# Patient Record
Sex: Male | Born: 1995 | Race: White | Hispanic: No | Marital: Single
Health system: Southern US, Community
[De-identification: ages and names within clinical notes are randomized; demographics above are authoritative.]

## PROBLEM LIST (undated history)

## (undated) DIAGNOSIS — I10 Essential (primary) hypertension: Secondary | ICD-10-CM

---

## 2019-03-04 ENCOUNTER — Other Ambulatory Visit: Payer: Self-pay | Admitting: Unknown Physician Specialty

## 2019-03-04 DIAGNOSIS — R221 Localized swelling, mass and lump, neck: Secondary | ICD-10-CM

## 2019-03-04 DIAGNOSIS — L040 Acute lymphadenitis of face, head and neck: Secondary | ICD-10-CM

## 2019-03-13 ENCOUNTER — Ambulatory Visit
Admission: RE | Admit: 2019-03-13 | Discharge: 2019-03-13 | Disposition: A | Discharge status: Home / Self Care | Payer: BC Managed Care – PPO | Source: Ambulatory Visit | Attending: Unknown Physician Specialty | Admitting: Unknown Physician Specialty

## 2019-03-13 ENCOUNTER — Other Ambulatory Visit: Payer: Self-pay

## 2019-03-13 DIAGNOSIS — L040 Acute lymphadenitis of face, head and neck: Secondary | ICD-10-CM | POA: Insufficient documentation

## 2019-03-13 DIAGNOSIS — R221 Localized swelling, mass and lump, neck: Secondary | ICD-10-CM

## 2019-03-13 HISTORY — DX: Essential (primary) hypertension: I10

## 2019-03-13 LAB — POCT I-STAT CREATININE: Creatinine, Ser: 0.9 mg/dL (ref 0.61–1.24)

## 2019-03-13 MED ORDER — IOHEXOL 300 MG/ML  SOLN
75.0000 mL | Freq: Once | INTRAMUSCULAR | Status: AC | PRN
  Administered 2019-03-13: 75 mL via INTRAVENOUS

## 2019-03-21 ENCOUNTER — Other Ambulatory Visit: Payer: Self-pay | Admitting: Unknown Physician Specialty

## 2019-03-21 DIAGNOSIS — R221 Localized swelling, mass and lump, neck: Secondary | ICD-10-CM

## 2019-03-28 ENCOUNTER — Ambulatory Visit: Payer: BC Managed Care – PPO

## 2019-04-03 ENCOUNTER — Other Ambulatory Visit: Payer: Self-pay | Admitting: Radiology

## 2019-04-03 ENCOUNTER — Ambulatory Visit: Payer: BC Managed Care – PPO

## 2019-04-04 ENCOUNTER — Ambulatory Visit
Admission: RE | Admit: 2019-04-04 | Discharge: 2019-04-04 | Disposition: A | Discharge status: Home / Self Care | Payer: BC Managed Care – PPO | Source: Ambulatory Visit | Attending: Unknown Physician Specialty | Admitting: Unknown Physician Specialty

## 2019-04-04 ENCOUNTER — Other Ambulatory Visit: Payer: Self-pay

## 2019-04-04 ENCOUNTER — Other Ambulatory Visit: Payer: Self-pay | Admitting: Unknown Physician Specialty

## 2019-04-04 DIAGNOSIS — R221 Localized swelling, mass and lump, neck: Secondary | ICD-10-CM

## 2019-04-04 NOTE — Progress Notes (Signed)
Procedure not done-see MD note.  Released to go home.

## 2019-08-30 IMAGING — US SOFT TISSUE ULTRASOUND HEAD/NECK
1 series · 2 of 2 positions shown · non-contrast
Comparison: 03/13/2019

CLINICAL DATA: Palpable right neck mass with abnormal lymph nodes
on recent CT examination

EXAM:
ULTRASOUND OF HEAD/NECK SOFT TISSUES
TECHNIQUE: Ultrasound examination of the head and neck soft tissues was
performed in the area of clinical concern.

[Series 1: soft tissue ultrasound head/neck · 2 of 2 slices shown]
[im 1/2]
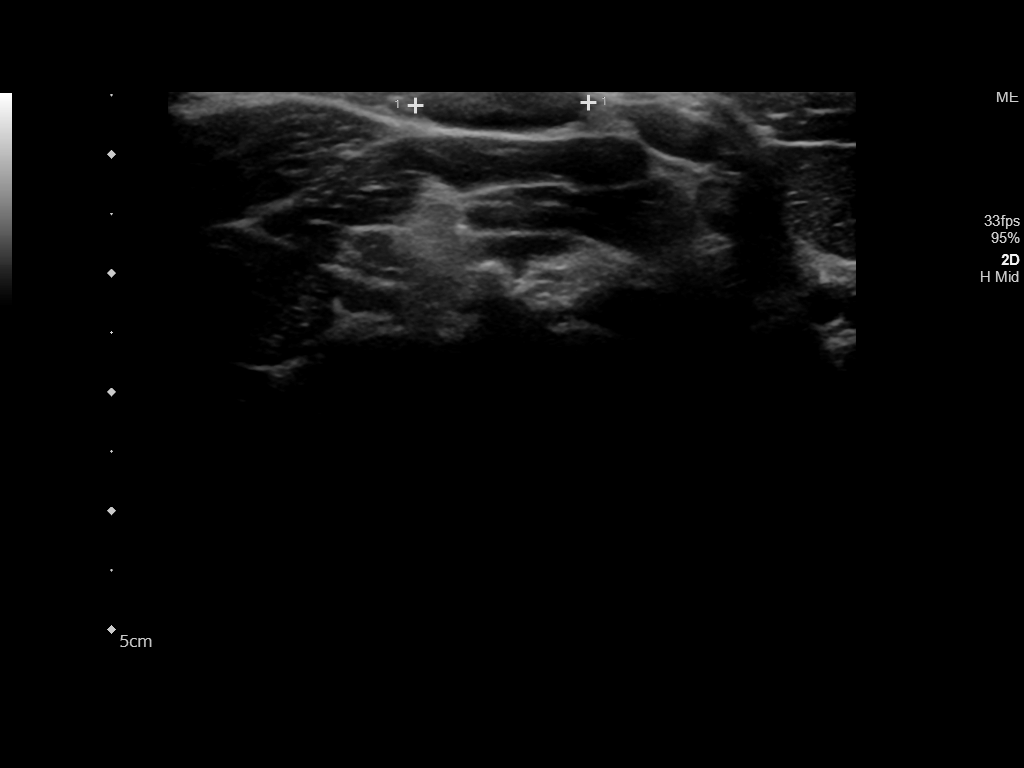
[im 2/2]
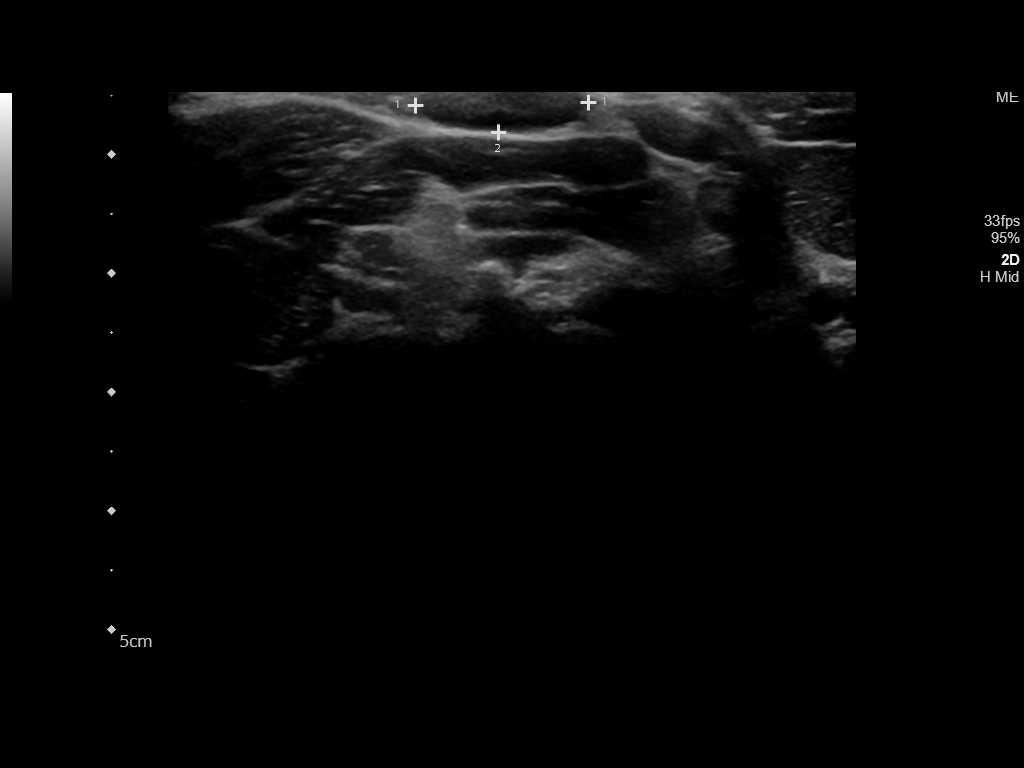

[2 of 2 positions shown; findings below may reference images not displayed]

FINDINGS: Patient was brought to the ultrasound suite for evaluation of a
level 5 lymph node on the right which corresponds to a palpable
abnormality posterior to the sternocleidomastoid. Previous CT showed
this to measure 10 mm in short axis and 17 mm in long axis.
Evaluation on today's exam shows this nodule to measure
approximately 5 mm in short axis and 1.5 cm in long axis. A fatty
hilus is noted centrally. Additionally the clinical symptomatology
has improved.
IMPRESSION: Reduction in size of level 5 right neck lymph node. Given the
improvement in size and clinical picture, biopsy was deferred at
this time. This was discussed with the patient and Dr. Somers.

## 2023-10-16 ENCOUNTER — Ambulatory Visit: Payer: BC Managed Care – PPO | Admitting: Dietician

## 2023-11-22 ENCOUNTER — Encounter: Payer: BC Managed Care – PPO | Attending: Internal Medicine | Admitting: Dietician

## 2023-11-22 ENCOUNTER — Encounter: Payer: Self-pay | Admitting: Dietician

## 2023-11-22 VITALS — Ht 66.0 in | Wt 186.5 lb

## 2023-11-22 DIAGNOSIS — E782 Mixed hyperlipidemia: Secondary | ICD-10-CM

## 2023-11-22 DIAGNOSIS — I10 Essential (primary) hypertension: Secondary | ICD-10-CM

## 2023-11-22 NOTE — Progress Notes (Signed)
 Medical Nutrition Therapy: Visit start time: 0830  end time: 0930  Assessment:   Referral Diagnosis: hyperlipidemia Other medical history/ diagnoses: HTN (2017) Psychosocial issues/ stress concerns: none  Medications, supplements: reconciled list in medical record   Current weight: 186.5lbs Height: 5'6" BMI: 30.1   Patient's weight goal: 150-155lbs    Progress and evaluation:  Patient reports working on weight control for several years; took [employer] weight loss supplement until last month, did not work.  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  Then hit plateau and work schedule changed, so unable to continue. Cholesterol has increased since 2020. BP currently well controlled with medications. Recent lab results: 08/11/23 total cholesterol 233, HDL 49.8, LDL 142, triglycerides 208; HbA1C 5.1 Does not like many veg, mostly eats squash, zucchini, and okra -- sauteed, and salads. Does like most fruits, also beans. Food allergies: mayonnaise Patient seeks help with losing 20-25lbs, reducing health risk    Dietary Intake:  Usual eating pattern includes 2-3 meals and 1-2 snacks per day. Dining out frequency: multiple meals per week. Who plans meals/ buys groceries? self Who prepares meals? self  Breakfast: skips (practice for several years) Snack: apple 1-2 ; occ out egg white muffin Lunch: takeout from sheet ie pretzel and hot dogs, or chick fila Snack: usually very hungry -- various snacks from home, or sometimes McDonald's or [place] takeout Supper: chicken/ beef usually no sides Snack: none; rarely cookies Beverages: 1c coffee in am, not much water maybe 48oz daily, zero sugar soda 2-3 afternoon/ evening  Physical activity: significant walking at work; 30-45 minutes walking at home, planning to start running, strength training   Intervention:   Nutrition Care Education:    Basic nutrition: basic food groups; appropriate nutrient balance; appropriate meal and snack schedule; general nutrition guidelines    Weight control: determining reasonable weight loss rate; importance of low sugar and low fat choices; appropriate food portions; estimated energy needs for weight loss at 1600-1700 kcal, provided guidance for 45% CHO, 25% protein, 30% fat Advanced nutrition: cooking techniques -- meal prepping; dining out Hypertension:  identifying high sodium foods, goal for sodium intake; identifying food sources of potassium, magnesium; options for seasoning foods Hyperlipidemia:  target goals for lipids; healthy and unhealthy fats; role of fiber, plant sterols; role of exercise  Other intervention notes: Patient has begun making positive diet and lifestyle changes and is motivated to continue. Established additional goals for change with direction from patient. No follow up scheduled, patient will be out of state for a period of time. He will schedule later as needed.   Nutritional Diagnosis:  [place]-2.1 Inpaired nutrition utilization and [place]-2.2 Altered nutrition-related laboratory As related to hyperlipidemia, HTN.  As evidenced by elevated total cholesterol, LDL, and triglycerides; BP controlled with medications. [place]-3.3 Overweight/obesity As related to excess calories with inadequate physical activity.  As evidenced by patient with current BMI of 30, working on changes to promote weight loss and recude health risk.   Education Materials given:  General diet guidelines for Cholesterol-lowering/ Heart health Foods to Choose to Lower Cholesterol (AHA) Plate Planner with food lists, sample meal pattern Carb-mindful smoothie recipe template Get Healthy with Mediterranean-style Eating Visit summary with goals/ instructions   Learner/ who was taught:  Patient   Level of understanding: Verbalizes/ demonstrates competency  Demonstrated degree of understanding via:   Teach  back Learning barriers: None  Willingness to learn/  readiness for change: Eager, change in progress  Monitoring and Evaluation:  Dietary intake, exercise, blood lipids, BP, and body weight      follow up: prn

## 2023-11-22 NOTE — Patient Instructions (Addendum)
 Plan time to pre-prep lunch meals.  Work to include at least one fruit or vegetable with each meal. OK to start with small portions and gradually increase, or to have multiple servings of veg at a time. Keep to 1-2 servings of fruit to control sugar.  Continue with plans to increase physical activity and exercise.  Limit sodium by bringing lunches from home, checking food labels, limit deep fried foods Check www.oldwayspt.org for more info and recipes for Mediterranean Track food intake for a week or more, at least every 1-2 months to monitor progress.
